# Patient Record
Sex: Female | Born: 1945 | Race: White | Hispanic: No | Smoking: Never smoker
Health system: Southern US, Community
[De-identification: ages and names within clinical notes are randomized; demographics above are authoritative.]

## PROBLEM LIST (undated history)

## (undated) ENCOUNTER — Ambulatory Visit: Admission: EM | Source: Home / Self Care

## (undated) DIAGNOSIS — I10 Essential (primary) hypertension: Secondary | ICD-10-CM

## (undated) HISTORY — PX: ROTATOR CUFF REPAIR: SHX139

## (undated) HISTORY — PX: ANKLE SURGERY: SHX546

---

## 1997-12-03 ENCOUNTER — Other Ambulatory Visit: Admission: RE | Admit: 1997-12-03 | Discharge: 1997-12-03 | Payer: Self-pay | Admitting: Obstetrics and Gynecology

## 1997-12-03 ENCOUNTER — Other Ambulatory Visit: Admission: RE | Admit: 1997-12-03 | Discharge: 1997-12-03 | Payer: Self-pay | Admitting: *Deleted

## 1998-12-06 ENCOUNTER — Other Ambulatory Visit: Admission: RE | Admit: 1998-12-06 | Discharge: 1998-12-06 | Payer: Self-pay | Admitting: *Deleted

## 1998-12-07 ENCOUNTER — Other Ambulatory Visit: Admission: RE | Admit: 1998-12-07 | Discharge: 1998-12-07 | Payer: Self-pay | Admitting: Obstetrics and Gynecology

## 2006-06-14 ENCOUNTER — Ambulatory Visit: Payer: Self-pay | Admitting: Internal Medicine

## 2006-06-26 ENCOUNTER — Ambulatory Visit: Payer: Self-pay | Admitting: Internal Medicine

## 2006-07-05 ENCOUNTER — Ambulatory Visit: Payer: Self-pay | Admitting: Surgery

## 2007-01-18 ENCOUNTER — Ambulatory Visit: Payer: Self-pay | Admitting: Family Medicine

## 2007-01-21 ENCOUNTER — Ambulatory Visit: Payer: Self-pay | Admitting: Internal Medicine

## 2007-07-16 ENCOUNTER — Ambulatory Visit: Payer: Self-pay | Admitting: Family Medicine

## 2007-09-26 ENCOUNTER — Ambulatory Visit: Payer: Self-pay | Admitting: Surgery

## 2007-11-29 IMAGING — US ULTRASOUND RIGHT BREAST
1 series · 16 of 16 positions shown · non-contrast
Comparison: none

REASON FOR EXAM: right breast nodular density   US if needed
COMMENTS:

PROCEDURE:     US  - US BREAST RIGHT  - June 26, 2006  [DATE]
RESULT:        There is a 7 mm complex cystic lesion in the inferior medial
portion of the RIGHT breast.  This could represent a small malignancy and
surgical consultation is suggested.

[Series 1: ultrasound right breast · 16 of 16 slices shown]
[im 1/16]
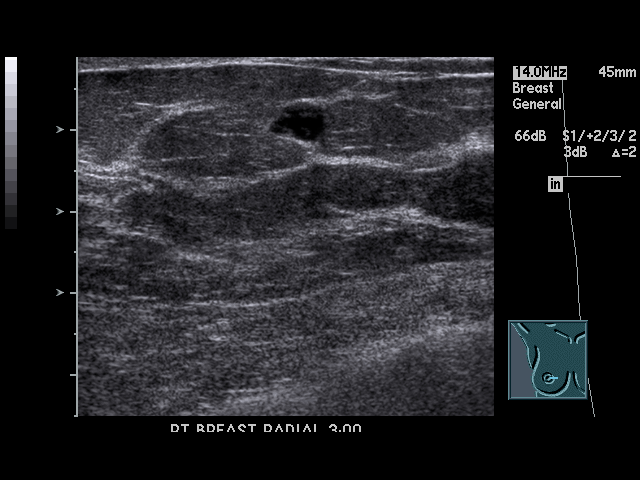
[im 2/16]
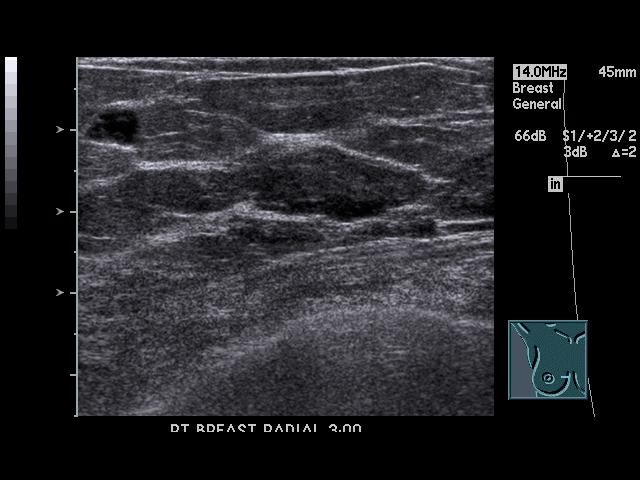
[im 3/16]
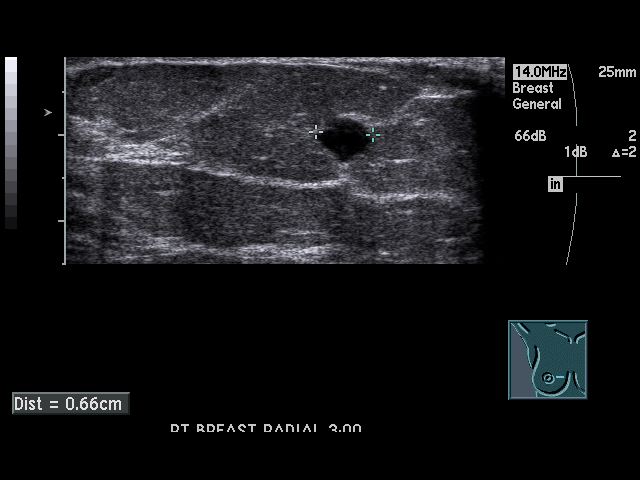
[im 4/16]
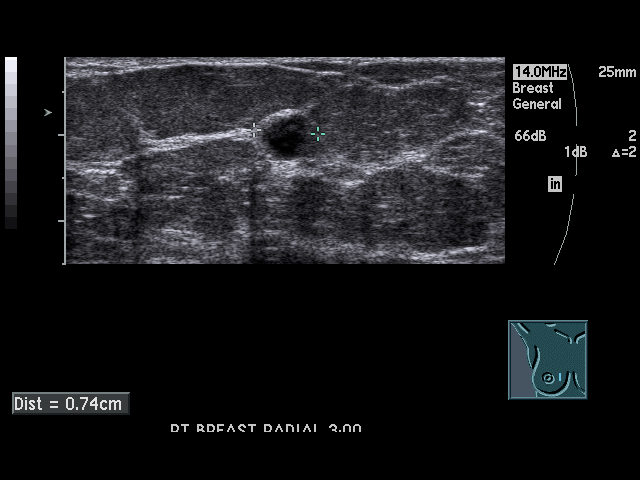
[im 5/16]
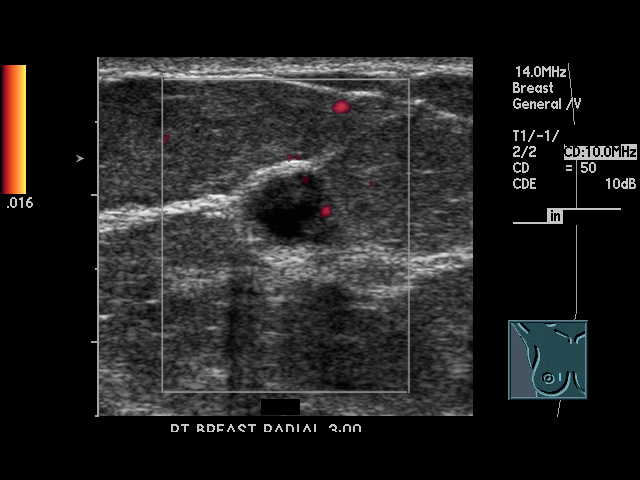
[im 6/16]
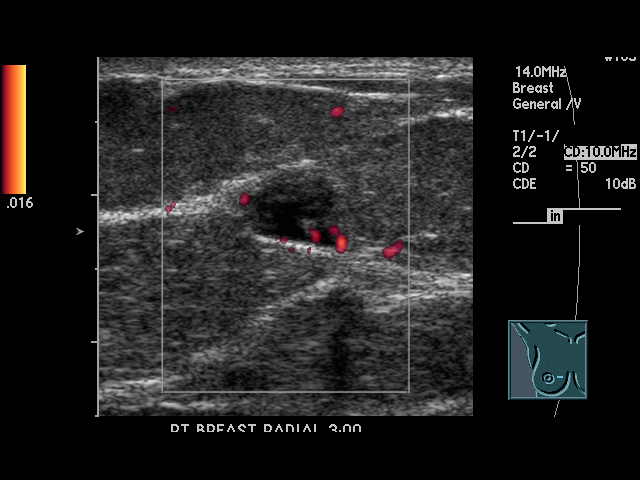
[im 7/16]
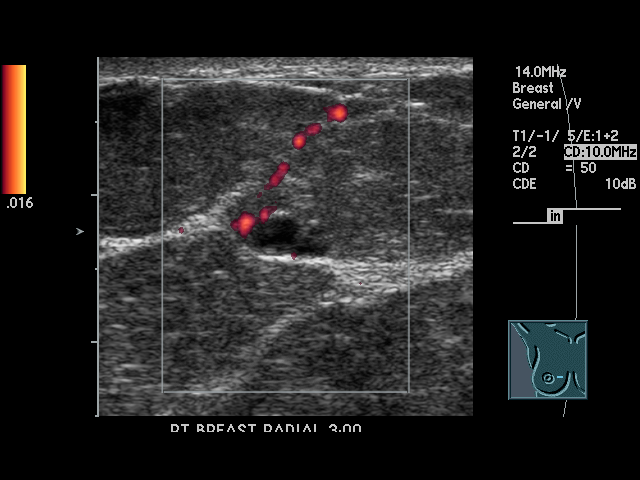
[im 8/16]
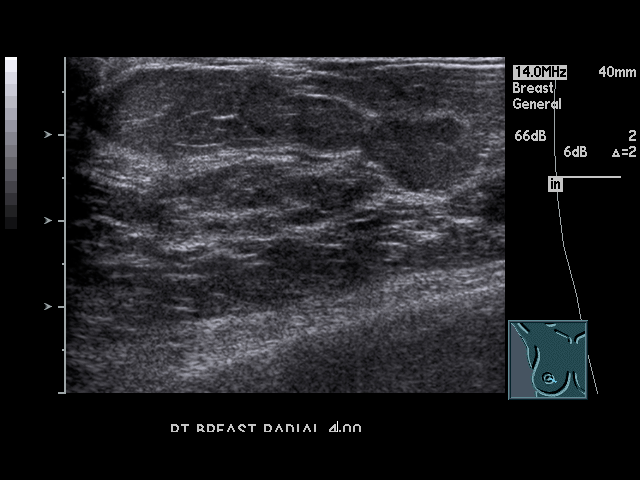
[im 9/16]
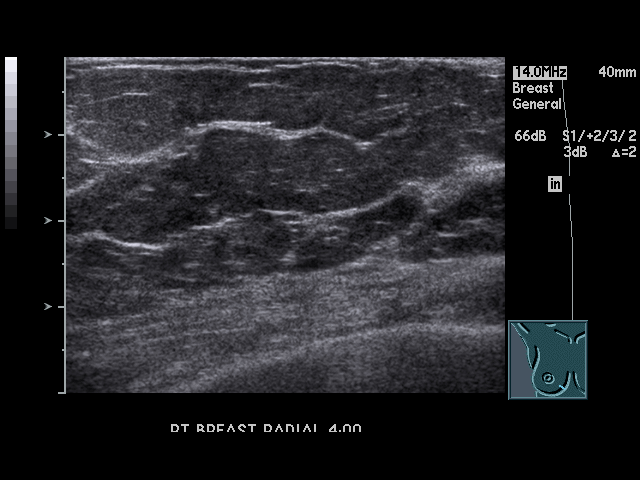
[im 10/16]
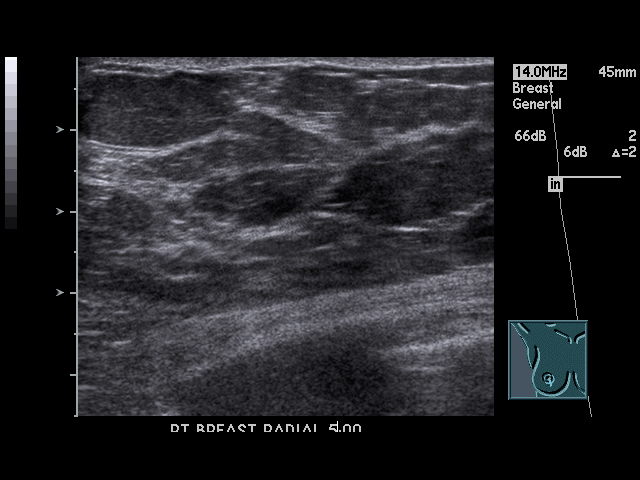
[im 11/16]
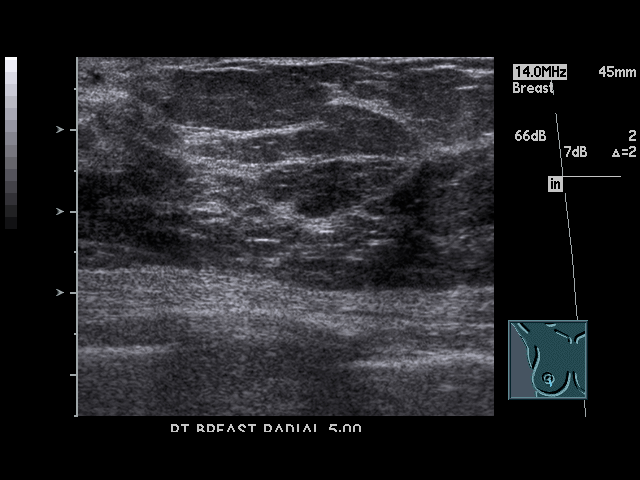
[im 12/16]
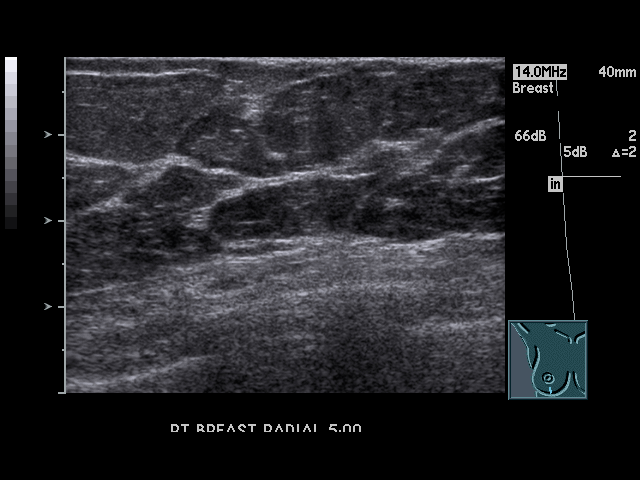
[im 13/16]
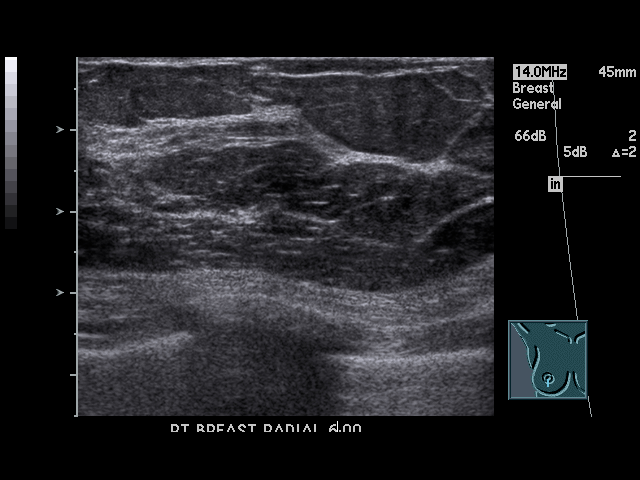
[im 14/16]
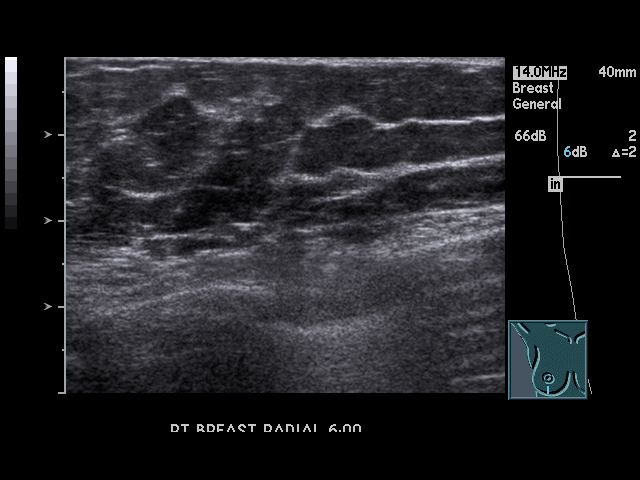
[im 15/16]
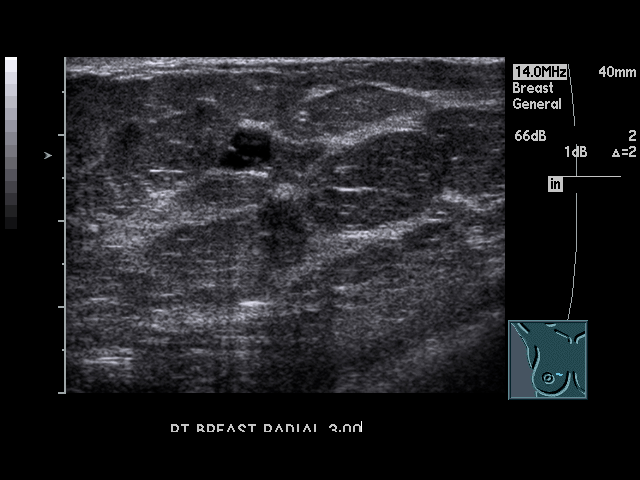
[im 16/16]
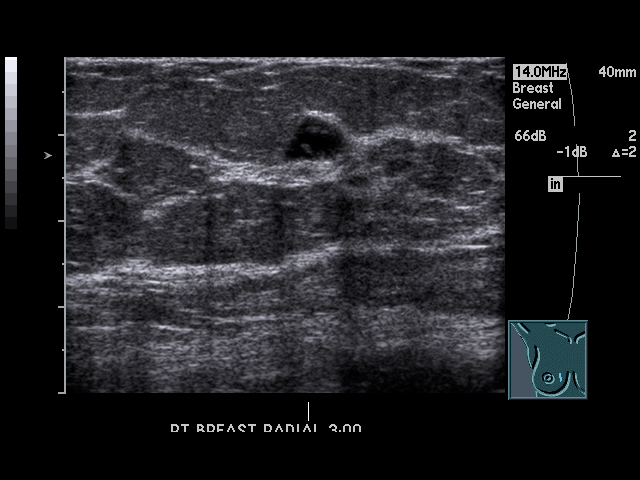

[16 of 16 positions shown; findings below may reference images not displayed]

IMPRESSION: 7 mm complex cystic lesion in the inferior medial portion of the RIGHT
breast.  Although this is most likely a benign cyst, a cystic malignancy
could present in this fashion and surgical consultation is suggested. Needle
localization and biopsy is recommended.

## 2008-10-07 ENCOUNTER — Ambulatory Visit: Payer: Self-pay | Admitting: Surgery

## 2009-09-05 ENCOUNTER — Ambulatory Visit: Payer: Self-pay | Admitting: Internal Medicine

## 2009-09-07 ENCOUNTER — Ambulatory Visit: Payer: Self-pay | Admitting: Internal Medicine

## 2009-09-15 ENCOUNTER — Ambulatory Visit: Payer: Self-pay | Admitting: Internal Medicine

## 2014-11-21 ENCOUNTER — Ambulatory Visit
Admission: EM | Admit: 2014-11-21 | Discharge: 2014-11-21 | Disposition: A | Discharge status: Home / Self Care | Payer: Medicare Other | Attending: Emergency Medicine | Admitting: Emergency Medicine

## 2014-11-21 ENCOUNTER — Encounter: Payer: Self-pay | Admitting: Emergency Medicine

## 2014-11-21 ENCOUNTER — Ambulatory Visit: Payer: Medicare Other

## 2014-11-21 DIAGNOSIS — S8262XA Displaced fracture of lateral malleolus of left fibula, initial encounter for closed fracture: Secondary | ICD-10-CM | POA: Diagnosis not present

## 2014-11-21 DIAGNOSIS — W010XXA Fall on same level from slipping, tripping and stumbling without subsequent striking against object, initial encounter: Secondary | ICD-10-CM | POA: Diagnosis not present

## 2014-11-21 DIAGNOSIS — M79672 Pain in left foot: Secondary | ICD-10-CM | POA: Diagnosis present

## 2014-11-21 HISTORY — DX: Essential (primary) hypertension: I10

## 2014-11-21 MED ORDER — ACETAMINOPHEN 500 MG PO TABS: 1000.0000 mg | ORAL_TABLET | Freq: Four times a day (QID) | ORAL | Status: DC | PRN

## 2014-11-21 MED ORDER — ACETAMINOPHEN-CODEINE #3 300-30 MG PO TABS: 1.0000 | ORAL_TABLET | Freq: Every evening | ORAL | Status: DC | PRN

## 2014-11-21 MED ORDER — IBUPROFEN 800 MG PO TABS: 800.0000 mg | ORAL_TABLET | Freq: Three times a day (TID) | ORAL | Status: AC

## 2014-11-21 NOTE — Discharge Instructions (Signed)
Ankle Fracture °A fracture is a break in a bone. The ankle joint is made up of three bones. These include the lower (distal) sections of your lower leg bones, called the tibia and fibula, along with a bone in your foot, called the talus. Depending on how bad the break is and if more than one ankle joint bone is broken, a cast or splint is used to protect and keep your injured bone from moving while it heals. Sometimes, surgery is required to help the fracture heal properly.  °There are two general types of fractures: °· Stable fracture. This includes a single fracture line through one bone, with no injury to ankle ligaments. A fracture of the talus that does not have any displacement (movement of the bone on either side of the fracture line) is also stable. °· Unstable fracture. This includes more than one fracture line through one or more bones in the ankle joint. It also includes fractures that have displacement of the bone on either side of the fracture line. °CAUSES °1. A direct blow to the ankle.   °2. Quickly and severely twisting your ankle. °3. Trauma, such as a car accident or falling from a significant height. °RISK FACTORS °You may be at a higher risk of ankle fracture if: °1. You have certain medical conditions. °2. You are involved in high-impact sports. °3. You are involved in a high-impact car accident. °SIGNS AND SYMPTOMS  °1. Tender and swollen ankle. °2. Bruising around the injured ankle. °3. Pain on movement of the ankle. °4. Difficulty walking or putting weight on the ankle. °5. A cold foot below the site of the ankle injury. This can occur if the blood vessels passing through your injured ankle were also damaged. °6. Numbness in the foot below the site of the ankle injury. °DIAGNOSIS  °An ankle fracture is usually diagnosed with a physical exam and X-rays. A CT scan may also be required for complex fractures. °TREATMENT  °Stable fractures are treated with a cast or splint and using crutches to  avoid putting weight on your injured ankle. This is followed by an ankle strengthening program. Some patients require a special type of cast, depending on other medical problems they may have. Unstable fractures require surgery to ensure the bones heal properly. Your health care provider will tell you what type of fracture you have and the best treatment for your condition. °HOME CARE INSTRUCTIONS  °1. Review correct crutch use with your health care provider and use your crutches as directed. Safe use of crutches is extremely important. Misuse of crutches can cause you to fall or cause injury to nerves in your hands or armpits. °2. Do not put weight or pressure on the injured ankle until directed by your health care provider. °3. To lessen the swelling, keep the injured leg elevated while sitting or lying down. °4. Apply ice to the injured area: °1. Put ice in a plastic bag. °2. Place a towel between your cast and the bag. °3. Leave the ice on for 20 minutes, 2-3 times a day. °5. If you have a plaster or fiberglass cast: °1. Do not try to scratch the skin under the cast with any objects. This can increase your risk of skin infection. °2. Check the skin around the cast every day. You may put lotion on any red or sore areas. °3. Keep your cast dry and clean. °6. If you have a plaster splint: °1. Wear the splint as directed. °2. You may loosen the elastic   around the splint if your toes become numb, tingle, or turn cold or blue. °7. Do not put pressure on any part of your cast or splint; it may break. Rest your cast only on a pillow the first 24 hours until it is fully hardened. °8. Your cast or splint can be protected during bathing with a plastic bag sealed to your skin with medical tape. Do not lower the cast or splint into water. °9. Take medicines as directed by your health care provider. Only take over-the-counter or prescription medicines for pain, discomfort, or fever as directed by your health care  provider. °10. Do not drive a vehicle until your health care provider specifically tells you it is safe to do so. °11. If your health care provider has given you a follow-up appointment, it is very important to keep that appointment. Not keeping the appointment could result in a chronic or permanent injury, pain, and disability. If you have any problem keeping the appointment, call the facility for assistance. °SEEK MEDICAL CARE IF: °You develop increased swelling or discomfort. °SEEK IMMEDIATE MEDICAL CARE IF:  °1. Your cast gets damaged or breaks. °2. You have continued severe pain. °3. You develop new pain or swelling after the cast was put on. °4. Your skin or toenails below the injury turn blue or gray. °5. Your skin or toenails below the injury feel cold, numb, or have loss of sensitivity to touch. °6. There is a bad smell or pus draining from under the cast. °MAKE SURE YOU:  °1. Understand these instructions. °2. Will watch your condition. °3. Will get help right away if you are not doing well or get worse. °Document Released: 03/24/2000 Document Revised: 04/01/2013 Document Reviewed: 10/24/2012 °Exit[employer]® Patient Information ©2015 [employer]. This information is not intended to replace advice given to you by your health care provider. Make sure you discuss any questions you have with your health care provider. °Cast or Splint Care °Casts and splints support injured limbs and keep bones from moving while they heal. It is important to care for your cast or splint at home.   °HOME CARE INSTRUCTIONS °· Keep the cast or splint uncovered during the drying period. It can take 24 to 48 hours to dry if it is made of plaster. A fiberglass cast will dry in less than 1 hour. °· Do not rest the cast on anything harder than a pillow for the first 24 hours. °· Do not put weight on your injured limb or apply pressure to the cast until your health care provider gives you permission. °· Keep the cast or splint dry. Wet  casts or splints can lose their shape and may not support the limb as well. A wet cast that has lost its shape can also create harmful pressure on your skin when it dries. Also, wet skin can become infected. °· Cover the cast or splint with a plastic bag when bathing or when out in the rain or snow. If the cast is on the trunk of the body, take sponge baths until the cast is removed. °· If your cast does become wet, dry it with a towel or a blow dryer on the cool setting only. °· Keep your cast or splint clean. Soiled casts may be wiped with a moistened cloth. °· Do not place any hard or soft foreign objects under your cast or splint, such as cotton, toilet paper, lotion, or powder. °· Do not try to scratch the skin under the cast with any   object. The object could get stuck inside the cast. Also, scratching could lead to an infection. If itching is a problem, use a blow dryer on a cool setting to relieve discomfort. °· Do not trim or cut your cast or remove padding from inside of it. °· Exercise all joints next to the injury that are not immobilized by the cast or splint. For example, if you have a long leg cast, exercise the hip joint and toes. If you have an arm cast or splint, exercise the shoulder, elbow, thumb, and fingers. °· Elevate your injured arm or leg on 1 or 2 pillows for the first 1 to 3 days to decrease swelling and pain. It is best if you can comfortably elevate your cast so it is higher than your heart. °SEEK MEDICAL CARE IF:  °4. Your cast or splint cracks. °5. Your cast or splint is too tight or too loose. °6. You have unbearable itching inside the cast. °7. Your cast becomes wet or develops a soft spot or area. °8. You have a bad smell coming from inside your cast. °9. You get an object stuck under your cast. °10. Your skin around the cast becomes red or raw. °11. You have new pain or worsening pain after the cast has been applied. °SEEK IMMEDIATE MEDICAL CARE IF:  °4. You have fluid leaking  through the cast. °5. You are unable to move your fingers or toes. °6. You have discolored (blue or white), cool, painful, or very swollen fingers or toes beyond the cast. °7. You have tingling or numbness around the injured area. °8. You have severe pain or pressure under the cast. °9. You have any difficulty with your breathing or have shortness of breath. °10. You have chest pain. °Document Released: 03/24/2000 Document Revised: 01/15/2013 Document Reviewed: 10/03/2012 °ExitCare® Patient Information ©2015 [employer]. This information is not intended to replace advice given to you by your health care provider. Make sure you discuss any questions you have with your health care provider. °Crutch Use °Crutches are used to take weight off one of your legs or feet when you stand or walk. It is important to use crutches that fit properly. When fitted properly: °· Each crutch should be 2-3 finger widths below the armpit. °· Your weight should be supported by your hand, and not by resting the armpit on the crutch. ° °RISKS AND COMPLICATIONS °Damage to the nerves that extend from your armpit to your hand and arm. To prevent this from happening, make sure your crutches fit properly and do not put pressure on your armpit when using them. °HOW TO USE YOUR CRUTCHES °If you have been instructed to use partial weight bearing, apply (bear) the amount of weight as your health care provider suggests. Do not bear weight in an amount that causes pain to the area of injury. °Walking °12. Step with the crutches. °13. Swing the healthy leg slightly ahead of the crutches. °Going Up Steps °If there is no handrail: °11. Step up with the healthy leg. °12. Step up with the crutches and injured leg. °13. Continue in this way. °If there is a handrail: °7. Hold both crutches in one hand. °8. Place your free hand on the handrail. °9. While putting your weight on your arms, lift your healthy leg to the step. °10. Bring the crutches and the  injured leg up to that step. °11. Continue in this way. °Going Down Steps °Be very careful, as going down stairs with crutches is very   challenging. If there is no handrail: °12. Step down with the injured leg and crutches. °13. Step down with the healthy leg. °If there is a handrail: °7. Place your hand on the handrail. °8. Hold both crutches with your free hand. °9. Lower your injured leg and crutch to the step below you. Make sure to keep the crutch tips in the center of the step, never on the edge. °10. Lower your healthy leg to that step. °11. Continue in this way. °Standing Up °4. Hold the injured leg forward. °5. Grab the armrest with one hand and the top of the crutches with the other hand. °6. Using these supports, pull yourself up to a standing position. °Sitting Down °1. Hold the injured leg forward. °2. Grab the armrest with one hand and the top of the crutches with the other hand. °3. Lower yourself to a sitting position. °SEEK MEDICAL CARE IF: °· You still feel unsteady on your feet. °· You develop new pain, for example in your armpits, back, shoulder, wrist, or hip. °· You develop any numbness or tingling. °SEEK IMMEDIATE MEDICAL CARE IF: °You fall. °Document Released: 03/24/2000 Document Revised: 04/01/2013 Document Reviewed: 12/02/2012 °ExitCare® Patient Information ©2015 [employer]. This information is not intended to replace advice given to you by your health care provider. Make sure you discuss any questions you have with your health care provider. ° °

## 2014-11-21 NOTE — ED Notes (Signed)
Injured left foot last night, Fell over dog

## 2014-11-21 NOTE — ED Provider Notes (Signed)
CSN: [id number]     Arrival date & time 11/21/14  1433 History   First MD Initiated Contact with Patient 11/21/14 1601     Chief Complaint  Patient presents with  . Foot Injury   (Consider location/radiation/quality/duration/timing/severity/associated sxs/prior Treatment) HPI Comments: Married caucasian female here for evaluation of left foot and ankle pain.  Did not notice her dog (boxer) sleeping next to living room chair stood up and tripped on dog onto hardwood floor twisted left ankle immediately swelled and did not want to weight bear on it.  Tried elevating and icing last night.  Today ok if resting but still painful, slightly swollen and bruised near all her toes and heel of foot.  Pain with bending toes left foot worst big toe.  Pain with moving ankle side to side sharp pain.  Patient is a 69 y.o. female presenting with foot injury. The history is provided by the patient.  Foot Injury Location:  Ankle, foot and toe Time since incident:  1 day Injury: yes   Mechanism of injury: fall   Fall:    Fall occurred:  Tripped   Height of fall:  Standing   Impact surface:  Hard floor   Point of impact:  Feet   Entrapped after fall: no   Ankle location:  L ankle Foot location:  L foot Toe location:  L big toe, L second toe, L third toe, L fourth toe and L little toe Pain details:    Quality:  Aching and throbbing   Radiates to:  Does not radiate   Severity:  Moderate   Onset quality:  Sudden   Duration:  1 day   Timing:  Intermittent   Progression:  Unchanged Chronicity:  New Foreign body present:  No foreign bodies Prior injury to area:  No Relieved by:  Nothing Worsened by:  Activity, bearing weight, abduction, adduction, flexion, rotation and extension Ineffective treatments:  Ice, rest, NSAIDs, movement and immobilization Associated symptoms: decreased ROM, stiffness and swelling   Associated symptoms: no back pain, no fatigue, no fever, no itching, no muscle weakness, no  neck pain, no numbness and no tingling   Risk factors: no concern for non-accidental trauma, no frequent fractures, no known bone disorder, no obesity and no recent illness     Past Medical History  Diagnosis Date  . Hypertension    Past Surgical History  Procedure Laterality Date  . Rotator cuff repair    . Cesarean section    . Ankle surgery Right    No family history on file. Social History  Substance Use Topics  . Smoking status: Never Smoker   . Smokeless tobacco: Never Used  . Alcohol Use: 0.6 oz/week    1 Glasses of wine per week   OB History    No data available     Review of Systems  Constitutional: Negative for fever, chills, diaphoresis, activity change, appetite change and fatigue.  HENT: Negative for congestion, dental problem, drooling, ear discharge, ear pain, facial swelling, hearing loss and mouth sores.   Eyes: Negative for photophobia, pain, discharge, redness, itching and visual disturbance.  Respiratory: Negative for cough, shortness of breath, wheezing and stridor.   Cardiovascular: Negative for chest pain and leg swelling.  Gastrointestinal: Negative for nausea, vomiting, diarrhea, constipation and blood in stool.  Endocrine: Negative for cold intolerance and heat intolerance.  Genitourinary: Negative for dysuria and hematuria.  Musculoskeletal: Positive for myalgias, joint swelling, arthralgias, gait problem and stiffness. Negative for back pain,  neck pain and neck stiffness.  Skin: Positive for color change. Negative for itching, pallor, rash and wound.  Allergic/Immunologic: Negative for environmental allergies and food allergies.  Neurological: Negative for dizziness, tremors, seizures, syncope, facial asymmetry, speech difficulty, weakness, light-headedness, numbness and headaches.  Hematological: Negative for adenopathy. Does not bruise/bleed easily.  Psychiatric/Behavioral: Negative for behavioral problems, confusion, sleep disturbance and  agitation.    Allergies  Review of patient's allergies indicates no known allergies.  Home Medications   Prior to Admission medications   Medication Sig Start Date End Date Taking? Authorizing Provider  benazepril (LOTENSIN) 10 MG tablet Take 10 mg by mouth daily.   Yes Historical Provider, MD  acetaminophen (TYLENOL) 500 MG tablet Take 2 tablets (1,000 mg total) by mouth every 6 (six) hours as needed for mild pain or moderate pain. 11/21/14   Barbaraann Barthel, NP  acetaminophen-codeine (TYLENOL #3) 300-30 MG per tablet Take 1 tablet by mouth at bedtime as needed for moderate pain or severe pain. 11/21/14   Barbaraann Barthel, NP  ibuprofen (ADVIL,MOTRIN) 800 MG tablet Take 1 tablet (800 mg total) by mouth 3 (three) times daily. 11/21/14   Jarold Song Betancourt, NP   BP 160/79 mmHg  Pulse 86  Temp(Src) 98.3 F (36.8 C) (Tympanic)  Resp 16  Ht 5\' 7"  (1.702 m)  Wt 170 lb (77.111 kg)  BMI 26.62 kg/m2  SpO2 98% Physical Exam  Constitutional: She is oriented to person, place, and time. Vital signs are normal. She appears well-developed and well-nourished. No distress.  HENT:  Head: Normocephalic and atraumatic.  Right Ear: External ear normal.  Left Ear: External ear normal.  Nose: Nose normal.  Mouth/Throat: Oropharynx is clear and moist. No oropharyngeal exudate.  Eyes: Conjunctivae, EOM and lids are normal. Pupils are equal, round, and reactive to light. Right eye exhibits no discharge. Left eye exhibits no discharge. No scleral icterus.  Neck: Trachea normal and normal range of motion. Neck supple. No tracheal deviation present.  Cardiovascular: Normal rate, regular rhythm and intact distal pulses.   Pulmonary/Chest: Effort normal and breath sounds normal. No stridor. No respiratory distress. She has no wheezes. She has no rales.  Abdominal: Soft. She exhibits no distension.  Musculoskeletal: She exhibits edema and tenderness.       Right ankle: Normal.       Left ankle: She exhibits  decreased range of motion, swelling and ecchymosis. She exhibits no deformity, no laceration and normal pulse. Tenderness. Lateral malleolus and head of 5th metatarsal tenderness found. No medial malleolus, no AITFL, no CF ligament, no posterior TFL and no proximal fibula tenderness found. Achilles tendon normal. Achilles tendon exhibits no pain and no defect.       Feet:  Pain with flexion, extension, eversion, inversion; distal tib/fib squeeze negative; pain with toe all flexion and extension worst with flexion distal foot/MTP joints  Neurological: She is alert and oriented to person, place, and time. She has normal reflexes. She displays no atrophy, no tremor and normal reflexes. No cranial nerve deficit or sensory deficit. She exhibits normal muscle tone. She displays no seizure activity. Coordination and gait normal.  Reflex Scores:      Achilles reflexes are 2+ on the right side and 2+ on the left side. Skin: Skin is warm, dry and intact. Bruising and ecchymosis noted. No abrasion, no burn, no laceration, no lesion, no petechiae, no purpura and no rash noted. Rash is not macular, not papular, not maculopapular, not nodular, not pustular, not  vesicular and not urticarial. She is not diaphoretic. No cyanosis or erythema. No pallor. Nails show no clubbing.     Psychiatric: She has a normal mood and affect. Her speech is normal and behavior is normal. Judgment and thought content normal. Cognition and memory are normal.  Nursing note and vitals reviewed.   ED Course  Procedures (including critical care time) Labs Review Labs Reviewed - No data to display  Imaging Review Dg Ankle Complete Left  11/21/2014   CLINICAL DATA:  69 year old female who tripped over dog yesterday with pain and swelling. Initial encounter.  EXAM: LEFT ANKLE COMPLETE - 3+ VIEW  COMPARISON:  None.  FINDINGS: Small comminuted minimally displaced fracture fragments at the tip of the left lateral malleolus. Overlying soft  tissue swelling. Mortise joint alignment is preserved. The talar dome is intact. Calcaneus intact. Small chronic appearing ossific fragment at the medial malleolus, no acute distal tibia fracture identified. No definite joint effusion.  IMPRESSION: Comminuted but minimally displaced fracture at the tip of the left lateral malleolus.   Electronically Signed   By: Odessa Fleming M.D.   On: 11/21/2014 16:35   Dg Foot Complete Left  11/21/2014   CLINICAL DATA:  69 year old female with history of trauma from a fall injuring the left foot and ankle yesterday complaining of pain and swelling overlying the lateral and anterior aspect of the left foot and ankle.  EXAM: LEFT FOOT - COMPLETE 3+ VIEW  COMPARISON:  No priors.  FINDINGS: Multiple views of the left foot demonstrate no acute displaced fracture, subluxation or dislocation. Soft tissue swelling dorsal to the midfoot incidentally noted.  IMPRESSION: No acute radiographic abnormality of the bones of the left foot.   Electronically Signed   By: Trudie Reed M.D.   On: 11/21/2014 16:35  1600 offered pain medication and patient refused at this time.  Elevated, iced awaiting radiology  Discussed xray results with patient at 1700.  Given copy of radiology reports and disk from radiology tech with images given to patient for use at her follow up appt with [facility] per her preference.  Patient to schedule her appt Monday 15 Aug by contacting their appt clerk as office closed over the weekend.  Follow up if toes blue/foot cold, worsening pain over weekend despite ice, rest, non weight bearing left foot/crutches use, elevation and pain medication for re-evaluation.  Patient verbalized understanding of information/instructions, agreed with plan of care and had no further questions at this time.  MDM   1. Closed low lateral malleolus fracture, left, initial encounter    Patient was instructed to rest, ice and elevate the ankle as much as possible this upcoming  week.  Avoid prolonged standing/dependent position left foot.   Wear camboot for support use crutches no weight bearing until cleared by orthopedics.  Patient preferred [facility] given number to call Monday as closed Sat/Sun and discussed hours [phone] M-F.  Tylenol 1000mg  po QID preferred for pain as does not raise blood pressure/counteract her lotensin.  May use motrin 800mg  po TID for breakthrough pain or tylenol #3 (previously taken without side effects) at bedtime for sleep #5 RF0.  Reminded patient if taking Tylenol #3 at bedtime needs to reduce tylenol dose to 500mg  immediately prior to bedtime so not to accidentally take more than 4000mg  tylenol per 24 hours or 1000mg  per dose. Discussed no driving for 8 hours after taking Tylenol #3 and avoid alcohol consumption while taking tylenol #3 pain medications.  Discussed at risk  to reinjure ankle over the next year and to wear supportive footwear/ankle sleeve/ace bandage once released back to normal street shoes.  [employer] handout on ankle fracture given to patient.  Patient retired work note note required at this time.  Patient verbalized agreement and understanding of treatment plan.   P2:  Injury Prevention and Fitness.    Barbaraann Barthel, NP 11/21/14 [phone]

## 2017-10-31 DIAGNOSIS — H40013 Open angle with borderline findings, low risk, bilateral: Secondary | ICD-10-CM | POA: Insufficient documentation

## 2017-10-31 DIAGNOSIS — H2513 Age-related nuclear cataract, bilateral: Secondary | ICD-10-CM | POA: Insufficient documentation

## 2018-05-09 ENCOUNTER — Encounter: Payer: Self-pay | Admitting: Emergency Medicine

## 2018-05-09 ENCOUNTER — Ambulatory Visit
Admission: EM | Admit: 2018-05-09 | Discharge: 2018-05-09 | Disposition: A | Discharge status: Home / Self Care | Payer: Medicare Other | Attending: Emergency Medicine | Admitting: Emergency Medicine

## 2018-05-09 ENCOUNTER — Other Ambulatory Visit: Payer: Self-pay

## 2018-05-09 ENCOUNTER — Ambulatory Visit (INDEPENDENT_AMBULATORY_CARE_PROVIDER_SITE_OTHER): Payer: Medicare Other

## 2018-05-09 DIAGNOSIS — S62616A Displaced fracture of proximal phalanx of right little finger, initial encounter for closed fracture: Secondary | ICD-10-CM | POA: Diagnosis not present

## 2018-05-09 DIAGNOSIS — S63286A Dislocation of proximal interphalangeal joint of right little finger, initial encounter: Secondary | ICD-10-CM | POA: Diagnosis not present

## 2018-05-09 DIAGNOSIS — W2209XA Striking against other stationary object, initial encounter: Secondary | ICD-10-CM | POA: Diagnosis not present

## 2018-05-09 DIAGNOSIS — S60946A Unspecified superficial injury of right little finger, initial encounter: Secondary | ICD-10-CM | POA: Diagnosis not present

## 2018-05-09 DIAGNOSIS — W182XXA Fall in (into) shower or empty bathtub, initial encounter: Secondary | ICD-10-CM

## 2018-05-09 DIAGNOSIS — S63256A Unspecified dislocation of right little finger, initial encounter: Secondary | ICD-10-CM

## 2018-05-09 NOTE — ED Triage Notes (Signed)
Patient c/o right pinky finger pain that started this morning. She stated she slipped in the shower this morning and caught herself with her hand bending her pinky finger back.

## 2018-05-09 NOTE — ED Provider Notes (Signed)
[facility] ____________________________________________  Time seen: Approximately 10:10 AM  I have reviewed the triage vital signs and the nursing notes.   HISTORY  Chief Complaint Hand Pain   HPI Lauren Roberson is a 73 y.o. female presenting for evaluation of right pinky finger pain after injury that occurred this morning.  States that she slipped while in the shower and hit her finger against the shower door causing it to bend somewhat backwards into the side.  Denies any other pain or injuries.  No fall otherwise.  Reports otherwise doing well.  States injury happened only because she slipped.  States pain is mild, but reports unable to bend the finger.  Right-hand-dominant.  Did try to bend the finger back without much luck.  Denies other alleviating measures attempted.  Denies other aggravating factors reports otherwise doing well.   Past Medical History:  Diagnosis Date  . Hypertension     There are no active problems to display for this patient.   Past Surgical History:  Procedure Laterality Date  . ANKLE SURGERY Right   . CESAREAN SECTION    . ROTATOR CUFF REPAIR       No current facility-administered medications for this encounter.   Current Outpatient Medications:  .  benazepril (LOTENSIN) 10 MG tablet, Take 10 mg by mouth daily., Disp: , Rfl:  .  acetaminophen (TYLENOL) 500 MG tablet, Take 2 tablets (1,000 mg total) by mouth every 6 (six) hours as needed for mild pain or moderate pain., Disp: 30 tablet, Rfl: 0 .  acetaminophen-codeine (TYLENOL #3) 300-30 MG per tablet, Take 1 tablet by mouth at bedtime as needed for moderate pain or severe pain., Disp: 5 tablet, Rfl: 0 .  ibuprofen (ADVIL,MOTRIN) 800 MG tablet, Take 1 tablet (800 mg total) by mouth 3 (three) times daily., Disp: 30 tablet, Rfl: 0  Allergies Patient has no known allergies.  History reviewed. No pertinent family history.  Social History Social History   Tobacco Use  . Smoking  status: Never Smoker  . Smokeless tobacco: Never Used  Substance Use Topics  . Alcohol use: Yes    Alcohol/week: 1.0 standard drinks    Types: 1 Glasses of wine per week  . Drug use: No    Review of Systems Constitutional: No fever Cardiovascular: Denies chest pain. Respiratory: Denies shortness of breath. Gastrointestinal: No abdominal pain.  Musculoskeletal: Negative for back pain.  As above. Skin: Negative for rash.   ____________________________________________   PHYSICAL EXAM:  VITAL SIGNS: ED Triage Vitals  Enc Vitals Group     BP 05/09/18 0841 134/82     Pulse Rate 05/09/18 0841 73     Resp 05/09/18 0841 18     Temp 05/09/18 0841 98.3 F (36.8 C)     Temp Source 05/09/18 0841 Oral     SpO2 05/09/18 0841 97 %     Weight 05/09/18 0840 160 lb (72.6 kg)     Height 05/09/18 0840 5\' 7"  (1.702 m)     Head Circumference --      Peak Flow --      Pain Score 05/09/18 0839 8     Pain Loc --      Pain Edu? --      Excl. in GC? --     Constitutional: Alert and oriented. Well appearing and in no acute distress. ENT      Head: Normocephalic and atraumatic. Cardiovascular: Normal rate, regular rhythm. Grossly normal heart sounds.  Good peripheral circulation. Respiratory:  Normal respiratory effort without tachypnea nor retractions. Breath sounds are clear and equal bilaterally. No wheezes, rales, rhonchi. Musculoskeletal: Steady gait.  Bilateral distal radial pulses equal and easily palpated. Except: Right hand fifth digit deformity noted at middle phalanx with mild localized edema, unable to flex at PIP joint, normal distal sensation and capillary refill, good distal resisted flexion and extension, post reduction reevaluated with full ability to flex at PIP joint and continues with good resisted flexion and extension. Neurologic:  Normal speech and language. No gross focal neurologic deficits are appreciated. Speech is normal. No gait instability.  Skin:  Skin is warm, dry  and intact. No rash noted. Psychiatric: Mood and affect are normal. Speech and behavior are normal. Patient exhibits appropriate insight and judgment   ___________________________________________   LABS (all labs ordered are listed, but only abnormal results are displayed)  Labs Reviewed - No data to display ____________________________________________  RADIOLOGY  Dg Finger Little Right  Result Date: 05/09/2018 CLINICAL DATA:  Status post finger dislocation reduction EXAM: RIGHT LITTLE FINGER 2+V COMPARISON:  Same day FINDINGS: Interval reduction of right fifth PIP dislocation with anatomic alignment. Sliver of bone along the dorsal ulnar aspect of the head of fifth proximal phalanx consistent with a nondisplaced fracture. No other fracture or dislocation. 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 IMPRESSION: 1. Interval reduction of right fifth PIP dislocation with anatomic alignment. Sliver of bone along the dorsal ulnar aspect of the head of fifth proximal phalanx consistent with a nondisplaced fracture. Electronically Signed   By: Elige Ko   On: 05/09/2018 10:14   Dg Finger Little Right  Result Date: 05/09/2018 CLINICAL DATA:  Fall with finger injury. EXAM: RIGHT LITTLE FINGER 2+V COMPARISON:  None. FINDINGS: Dorsal and ulnar severe subluxation of the fifth PIP joint with small avulsion fracture seen medially and dorsally. Osteopenia. IMPRESSION: Severe subluxation of the fifth PIP joint with small avulsion fracture. Electronically Signed   By: Marnee Spring M.D.   On: 05/09/2018 09:21   ____________________________________________   PROCEDURES Procedures   Finger reduction Procedure explained and verbal consent obtained. Anesthesia: None Right fifth digit held with proximal and distal traction, finger hyperextended and slid medially, with good ability to flex at PIP joint afterwards.  Patient tolerated well.   INITIAL IMPRESSION / ASSESSMENT AND PLAN / ED  COURSE  Pertinent labs & imaging results that were available during my care of the patient were reviewed by me and considered in my medical decision making (see chart for details).  Well-appearing patient.  No acute distress.  Right fifth digit pain post mechanical injury that occurred this a.m.  Dislocation noted on initial x-ray.  Finger reduced as above.  Patient tolerated well with improved range of motion.  Patient did note on initial x-ray small avulsion fracture of the proximal phalanx that still present post reduction.  Patient placed in finger splint and fourth and fifth finger buddy tape for support.  Continue finger splint for 1 week buddy tape for 3 to 4 weeks.  Ice, elevate and supportive care.  Discussed follow up with Primary care physician this week. Discussed follow up and return parameters including no resolution or any worsening concerns. Patient verbalized understanding and agreed to plan.   ____________________________________________   FINAL CLINICAL IMPRESSION(S) / ED DIAGNOSES  Final diagnoses:  Closed dislocation of right little finger  Closed displaced fracture of proximal phalanx of right little finger, initial encounter     ED Discharge Orders  None       Note: This dictation was prepared with Dragon dictation along with smaller phrase technology. Any transcriptional errors that result from this process are unintentional.         Renford DillsMiller, Trachelle Low, NP 05/09/18 1044

## 2018-05-09 NOTE — Discharge Instructions (Addendum)
Ice. Rest.  Wear finger splint and buddy tape as discussed.  Finger splint for 1 week and then buddy tape for 3 to 4 weeks.  Follow up with your primary care physician this week as needed. Return to Urgent care for new or worsening concerns.

## 2018-05-09 NOTE — ED Triage Notes (Signed)
Right pinky splint and buddy tape applied. Pt states splint is comfortable.

## 2019-05-21 DIAGNOSIS — N644 Mastodynia: Secondary | ICD-10-CM | POA: Insufficient documentation

## 2019-05-25 ENCOUNTER — Ambulatory Visit: Payer: Medicare PPO | Attending: Internal Medicine

## 2019-05-25 DIAGNOSIS — Z23 Encounter for immunization: Secondary | ICD-10-CM | POA: Insufficient documentation

## 2019-05-25 NOTE — Progress Notes (Signed)
   Covid-19 Vaccination Clinic  Name:  Lauren Roberson    MRN: [id number] DOB: 1945-07-10  05/25/2019  Ms. Lauren Roberson was observed post Covid-19 immunization for 15 minutes without incidence. She was provided with Vaccine Information Sheet and instruction to access the V-Safe system.   Ms. Lauren Roberson was instructed to call 911 with any severe reactions post vaccine: Marland Kitchen Difficulty breathing  . Swelling of your face and throat  . A fast heartbeat  . A bad rash all over your body  . Dizziness and weakness    Immunizations Administered    Name Date Dose VIS Date Route   Pfizer COVID-19 Vaccine 05/25/2019  9:43 AM 0.3 mL 03/21/2019 Intramuscular   Manufacturer: [employer], [employer]   Lot: [id number]   NDC: [address]-3

## 2019-06-18 ENCOUNTER — Ambulatory Visit: Payer: Medicare PPO | Attending: Internal Medicine

## 2019-06-18 DIAGNOSIS — Z23 Encounter for immunization: Secondary | ICD-10-CM

## 2019-06-18 NOTE — Progress Notes (Signed)
   Covid-19 Vaccination Clinic  Name:  JASMYN PICHA    MRN: [id number] DOB: 03/25/1946  06/18/2019  Ms. Carrasco was observed post Covid-19 immunization for 15 minutes without incident. She was provided with Vaccine Information Sheet and instruction to access the V-Safe system.   Ms. Starzyk was instructed to call 911 with any severe reactions post vaccine: Marland Kitchen Difficulty breathing  . Swelling of face and throat  . A fast heartbeat  . A bad rash all over body  . Dizziness and weakness   Immunizations Administered    Name Date Dose VIS Date Route   Pfizer COVID-19 Vaccine 06/18/2019  3:01 PM 0.3 mL 03/21/2019 Intramuscular   Manufacturer: [employer], [employer]   Lot: [id number]   NDC: [address]-5

## 2019-09-15 ENCOUNTER — Ambulatory Visit: Payer: Medicare Other | Admitting: Podiatry

## 2019-09-15 ENCOUNTER — Other Ambulatory Visit: Payer: Self-pay

## 2019-09-15 ENCOUNTER — Ambulatory Visit (INDEPENDENT_AMBULATORY_CARE_PROVIDER_SITE_OTHER): Payer: Medicare Other

## 2019-09-15 ENCOUNTER — Encounter: Payer: Self-pay | Admitting: Podiatry

## 2019-09-15 DIAGNOSIS — M2142 Flat foot [pes planus] (acquired), left foot: Secondary | ICD-10-CM

## 2019-09-15 DIAGNOSIS — M778 Other enthesopathies, not elsewhere classified: Secondary | ICD-10-CM

## 2019-09-15 DIAGNOSIS — M2141 Flat foot [pes planus] (acquired), right foot: Secondary | ICD-10-CM | POA: Diagnosis not present

## 2019-09-15 MED ORDER — MELOXICAM 15 MG PO TABS: 15.0000 mg | ORAL_TABLET | Freq: Every day | ORAL | 3 refills | Status: DC

## 2019-09-15 MED ORDER — METHYLPREDNISOLONE 4 MG PO TBPK: ORAL_TABLET | ORAL | 0 refills | Status: DC

## 2019-09-15 NOTE — Progress Notes (Signed)
Subjective:  Patient ID: Lauren Roberson, female    DOB: 07/09/1945,  MRN: [id number] HPI Chief Complaint  Patient presents with  . Foot Pain    Plantar foot bilateral - flat feet, burning, hot sensations, usually worse plantar forefoot, limited to certain shoes-most are uncomfortable  . New Patient (Initial Visit)    74 y.o. female presents with the above complaint.   ROS: She denies fever chills nausea vomiting muscle aches pains calf pain back pain chest pain shortness of breath.  Past Medical History:  Diagnosis Date  . Hypertension    Past Surgical History:  Procedure Laterality Date  . ANKLE SURGERY Right   . CESAREAN SECTION    . ROTATOR CUFF REPAIR      Current Outpatient Medications:  .  BIOTIN PO, Take by mouth., Disp: , Rfl:  .  Cholecalciferol (D3 PO), Take by mouth., Disp: , Rfl:  .  Pyridoxine HCl (B-6 PO), Take by mouth., Disp: , Rfl:  .  Turmeric 500 MG CAPS, Take by mouth., Disp: , Rfl:  .  benazepril (LOTENSIN) 10 MG tablet, Take 10 mg by mouth daily., Disp: , Rfl:  .  ibuprofen (ADVIL,MOTRIN) 800 MG tablet, Take 1 tablet (800 mg total) by mouth 3 (three) times daily., Disp: 30 tablet, Rfl: 0 .  meloxicam (MOBIC) 15 MG tablet, Take 1 tablet (15 mg total) by mouth daily., Disp: 30 tablet, Rfl: 3 .  methylPREDNISolone (MEDROL DOSEPAK) 4 MG TBPK tablet, 6 day dose pack - take as directed, Disp: 21 tablet, Rfl: 0 .  metoprolol succinate (TOPROL-XL) 25 MG 24 hr tablet, Take 25 mg by mouth daily., Disp: , Rfl:  .  omeprazole (PRILOSEC) 20 MG capsule, Take 20 mg by mouth daily., Disp: , Rfl:  .  simvastatin (ZOCOR) 10 MG tablet, Take 10 mg by mouth at bedtime., Disp: , Rfl:  .  venlafaxine XR (EFFEXOR-XR) 75 MG 24 hr capsule, Take 75 mg by mouth daily., Disp: , Rfl:   No Known Allergies Review of Systems Objective:  There were no vitals filed for this visit.  General: Well developed, nourished, in no acute distress, alert and oriented x3   Dermatological: Skin  is warm, dry and supple bilateral. Nails x 10 are well maintained; remaining integument appears unremarkable at this time. There are no open sores, no preulcerative lesions, no rash or signs of infection present.  Vascular: Dorsalis Pedis artery and Posterior Tibial artery pedal pulses are 2/4 bilateral with immedate capillary fill time. Pedal hair growth present. No varicosities and no lower extremity edema present bilateral.   Neruologic: Grossly intact via light touch bilateral. Vibratory intact via tuning fork bilateral. Protective threshold with Semmes Wienstein monofilament intact to all pedal sites bilateral. Patellar and Achilles deep tendon reflexes 2+ bilateral. No Babinski or clonus noted bilateral.   Musculoskeletal: No gross boney pedal deformities bilateral. No pain, crepitus, or limitation noted with foot and ankle range of motion bilateral. Muscular strength 5/5 in all groups tested bilateral.  Mild hammertoe deformities with pain on palpation and range of motion of the second metatarsophalangeal joints bilaterally.  Gait: Unassisted, Nonantalgic.    Radiographs:  Radiographs taken today demonstrate medial deviation of the toes with elongated plantarflexed second metatarsals bilaterally.  Mild hammertoe deformities noted.  Assessment & Plan:   Assessment: Capsulitis of the second metatarsophalangeal joints.  Mild hammertoe deformities.  Plan: Discussed etiology pathology conservative versus surgical therapies at this point I injected 20 mg Kenalog 5 mg Marcaine around the  second metatarsophalangeal joint.  Start her on a Medrol Dosepak and I will follow-up with her in about a month to 6 weeks.     Keymarion Bearman T. Har[place], [place]

## 2019-10-27 ENCOUNTER — Other Ambulatory Visit: Payer: Self-pay

## 2019-10-27 ENCOUNTER — Encounter (INDEPENDENT_AMBULATORY_CARE_PROVIDER_SITE_OTHER): Payer: Self-pay

## 2019-10-27 ENCOUNTER — Ambulatory Visit (INDEPENDENT_AMBULATORY_CARE_PROVIDER_SITE_OTHER): Payer: Medicare Other | Admitting: Podiatry

## 2019-10-27 ENCOUNTER — Encounter: Payer: Self-pay | Admitting: Podiatry

## 2019-10-27 DIAGNOSIS — M778 Other enthesopathies, not elsewhere classified: Secondary | ICD-10-CM

## 2019-10-27 NOTE — Progress Notes (Signed)
She presents today for follow-up of capsulitis bilateral second metatarsophalangeal joints.  She states that she is greater than 50% improved at this point.  She states that she has been trying to make a difference with her shoe gear and not walking barefooted.  She states that everything seems to be working at this point.  Objective: Vital signs are stable alert oriented x3 she has much decrease in erythema and edema to the plantar aspect of the forefoot bilaterally.  She still has medial deviation of the toes and some mild tenderness on palpation of the metatarsal phalangeal joint second bilaterally.  Assessment: Well-healing capsulitis second metatarsophalangeal joints with hammertoe deformities bilateral.  Plan: At this point I encouraged her to continue all conservative therapies as long as she was improving when she hits a plateau or starts to regress that she is to notify us immediately for injections.

## 2021-04-21 DIAGNOSIS — I471 Supraventricular tachycardia, unspecified: Secondary | ICD-10-CM | POA: Insufficient documentation

## 2022-01-04 ENCOUNTER — Ambulatory Visit
Admission: EM | Admit: 2022-01-04 | Discharge: 2022-01-04 | Disposition: A | Discharge status: Home / Self Care | Payer: Medicare PPO | Attending: Physician Assistant | Admitting: Physician Assistant

## 2022-01-04 DIAGNOSIS — J069 Acute upper respiratory infection, unspecified: Secondary | ICD-10-CM | POA: Insufficient documentation

## 2022-01-04 DIAGNOSIS — Z20822 Contact with and (suspected) exposure to covid-19: Secondary | ICD-10-CM | POA: Insufficient documentation

## 2022-01-04 DIAGNOSIS — R051 Acute cough: Secondary | ICD-10-CM | POA: Diagnosis present

## 2022-01-04 LAB — SARS CORONAVIRUS 2 BY RT PCR: SARS Coronavirus 2 by RT PCR: NEGATIVE

## 2022-01-04 MED ORDER — BENZONATATE 200 MG PO CAPS: 200.0000 mg | ORAL_CAPSULE | Freq: Three times a day (TID) | ORAL | 0 refills | Status: DC | PRN

## 2022-01-04 NOTE — ED Triage Notes (Signed)
Patient presents to [facility] for a cough that started last Friday. Patient reports that its pretty consistent.

## 2022-01-04 NOTE — Discharge Instructions (Addendum)
-  We will give you a call if your COVID test is positive.  If positive he will need to wear a mask for Days. - If you do not hear from [place] it is negative and you have another virus and most people get better within 7 to 10 days.  I have sent a cough medication that you may take and you can continue with your throat lozenges. - Return if you develop a fever, worsening cough, breathing difficulty, sinus pain, etc.  URI/COLD SYMPTOMS: Your exam today is consistent with a viral illness. Antibiotics are not indicated at this time. Use medications as directed, including cough syrup, nasal saline, and decongestants. Your symptoms should improve over the next few days and resolve within 7-10 days. Increase rest and fluids. F/u if symptoms worsen or predominate such as sore throat, ear pain, productive cough, shortness of breath, or if you develop high fevers or worsening fatigue over the next several days.

## 2022-01-04 NOTE — ED Provider Notes (Signed)
[facility]    CSN: [id number] Arrival date & time: 01/04/22  4098      History   Chief Complaint Chief Complaint  Patient presents with   Cough    HPI Lauren Roberson is a 76 y.o. female presenting for cough, congestion, postnasal drainage x5 days.  Patient denies fever, fatigue, chest pain or shortness of breath.  She says her chest was a little tight yesterday but her symptoms have all improved from onset.  She denies any sick contacts.  She has been taking over-the-counter menthol cough drops and says that has helped.  Patient has no history of COPD or asthma.  No other complaints.  HPI  Past Medical History:  Diagnosis Date   Hypertension     Patient Active Problem List   Diagnosis Date Noted   Breast pain, left 05/21/2019   Age-related nuclear cataract of both eyes 10/31/2017   OAG (open angle glaucoma) suspect, low risk, bilateral 10/31/2017   HTN (hypertension) 12/25/2012    Past Surgical History:  Procedure Laterality Date   ANKLE SURGERY Right    CESAREAN SECTION     ROTATOR CUFF REPAIR      OB History   No obstetric history on file.      Home Medications    Prior to Admission medications   Medication Sig Start Date End Date Taking? Authorizing Provider  benzonatate (TESSALON) 200 MG capsule Take 1 capsule (200 mg total) by mouth 3 (three) times daily as needed for cough. 01/04/22  Yes Laurene Footman B, PA-C  metoprolol succinate (TOPROL-XL) 25 MG 24 hr tablet Take 25 mg by mouth daily. 06/22/19  Yes [provider]  omeprazole (PRILOSEC) 20 MG capsule Take 20 mg by mouth daily. 08/31/19  Yes [provider]  simvastatin (ZOCOR) 10 MG tablet Take 10 mg by mouth at bedtime. 06/22/19  Yes [provider]  venlafaxine XR (EFFEXOR-XR) 75 MG 24 hr capsule Take 75 mg by mouth daily. 07/17/19  Yes [provider]  benazepril (LOTENSIN) 10 MG tablet Take 10 mg by mouth daily.    [provider]  BIOTIN PO Take  by mouth.    [provider]  Cholecalciferol (D3 PO) Take by mouth.    [provider]  ibuprofen (ADVIL,MOTRIN) 800 MG tablet Take 1 tablet (800 mg total) by mouth 3 (three) times daily. 11/21/14   Betancourt, Aura Fey, NP  meloxicam (MOBIC) 15 MG tablet Take 1 tablet (15 mg total) by mouth daily. 09/15/19   Hyatt, Max T, DPM  methylPREDNISolone (MEDROL DOSEPAK) 4 MG TBPK tablet 6 day dose pack - take as directed 09/15/19   Hyatt, Max T, DPM  Pyridoxine HCl (B-6 PO) Take by mouth.    [provider]  Turmeric 500 MG CAPS Take by mouth.    [provider]    Family History History reviewed. No pertinent family history.  Social History Social History   Tobacco Use   Smoking status: Never   Smokeless tobacco: Never  Vaping Use   Vaping Use: Never used  Substance Use Topics   Alcohol use: Yes    Alcohol/week: 1.0 standard drink of alcohol    Types: 1 Glasses of wine per week   Drug use: No     Allergies   Patient has no known allergies.   Review of Systems Review of Systems  Constitutional:  Negative for chills, diaphoresis, fatigue and fever.  HENT:  Positive for congestion, postnasal drip and rhinorrhea.  Negative for ear pain, sinus pressure, sinus pain and sore throat.   Respiratory:  Positive for cough. Negative for shortness of breath.   Gastrointestinal:  Negative for abdominal pain, nausea and vomiting.  Musculoskeletal:  Negative for arthralgias and myalgias.  Skin:  Negative for rash.  Neurological:  Negative for weakness and headaches.  Hematological:  Negative for adenopathy.     Physical Exam Triage Vital Signs ED Triage Vitals  Enc Vitals Group     BP 01/04/22 0948 (!) 136/96     Pulse Rate 01/04/22 0948 87     Resp --      Temp 01/04/22 0948 98.2 F (36.8 C)     Temp Source 01/04/22 0948 Oral     SpO2 01/04/22 0948 97 %     Weight 01/04/22 0947 182 lb (82.6 kg)     Height 01/04/22 0947 5\' 7"  (1.702 m)     Head  Circumference --      Peak Flow --      Pain Score 01/04/22 0947 0     Pain Loc --      Pain Edu? --      Excl. in GC? --    No data found.  Updated Vital Signs BP (!) 136/96 (BP Location: Left Arm)   Pulse 87   Temp 98.2 F (36.8 C) (Oral)   Ht 5\' 7"  (1.702 m)   Wt 182 lb (82.6 kg)   SpO2 97%   BMI 28.51 kg/m    Physical Exam Vitals and nursing note reviewed.  Constitutional:      General: She is not in acute distress.    Appearance: Normal appearance. She is not ill-appearing or toxic-appearing.  HENT:     Head: Normocephalic and atraumatic.     Nose: Congestion present.     Mouth/Throat:     Mouth: Mucous membranes are moist.     Pharynx: Oropharynx is clear.  Eyes:     General: No scleral icterus.       Right eye: No discharge.        Left eye: No discharge.     Conjunctiva/sclera: Conjunctivae normal.  Cardiovascular:     Rate and Rhythm: Normal rate and regular rhythm.     Heart sounds: Normal heart sounds.  Pulmonary:     Effort: Pulmonary effort is normal. No respiratory distress.     Breath sounds: Normal breath sounds. No wheezing, rhonchi or rales.  Musculoskeletal:     Cervical back: Neck supple.  Skin:    General: Skin is dry.  Neurological:     General: No focal deficit present.     Mental Status: She is alert. Mental status is at baseline.     Motor: No weakness.     Gait: Gait normal.  Psychiatric:        Mood and Affect: Mood normal.        Behavior: Behavior normal.        Thought Content: Thought content normal.      UC Treatments / Results  Labs (all labs ordered are listed, but only abnormal results are displayed) Labs Reviewed  SARS CORONAVIRUS 2 BY RT PCR    EKG   Radiology No results found.  Procedures Procedures (including critical care time)  Medications Ordered in UC Medications - No data to display  Initial Impression / Assessment and Plan / UC Course  I have reviewed the triage vital signs and the nursing  notes.  Pertinent labs &  imaging results that were available during my care of the patient were reviewed by me and considered in my medical decision making (see chart for details).   76 year old female presenting for 5-day history of cough, congestion, postnasal drainage.  Symptoms have improved from onset.  No fever, sore throat or breathing difficulty.  No known COVID exposure.  Vitals are all stable.  She is overall well-appearing.  No distress.  On exam she has mild nasal congestion.  Throat is clear.  Chest clear auscultation heart regular rate and rhythm.   COVID test obtained.  Pending results.  Advised patient we will call her if the results are positive.  Will consider antibiotic therapy if desired.  Reviewed current CDC guidelines, isolation protocol and ED precautions if COVID-positive.  Advised if she does not hear from [place], she likely has another virus.   Advised patient symptoms consistent with viral illness.  Supportive care encouraged.  Sent benzonatate for cough.  Advised her to return if she develops a fever, worsening cough or shortness of breath but advised that she should be feeling better over the next 2 to 5 days.   Final Clinical Impressions(s) / UC Diagnoses   Final diagnoses:  Viral URI with cough  Acute cough     Discharge Instructions      -We will give you a call if your COVID test is positive.  If positive he will need to wear a mask for Days. - If you do not hear from [place] it is negative and you have another virus and most people get better within 7 to 10 days.  I have sent a cough medication that you may take and you can continue with your throat lozenges. - Return if you develop a fever, worsening cough, breathing difficulty, sinus pain, etc.  URI/COLD SYMPTOMS: Your exam today is consistent with a viral illness. Antibiotics are not indicated at this time. Use medications as directed, including cough syrup, nasal saline, and decongestants. Your symptoms  should improve over the next few days and resolve within 7-10 days. Increase rest and fluids. F/u if symptoms worsen or predominate such as sore throat, ear pain, productive cough, shortness of breath, or if you develop high fevers or worsening fatigue over the next several days.       ED Prescriptions     Medication Sig Dispense Auth. Provider   benzonatate (TESSALON) 200 MG capsule Take 1 capsule (200 mg total) by mouth 3 (three) times daily as needed for cough. 20 capsule Shirlee Latch, PA-C      PDMP not reviewed this encounter.   Shirlee Latch, PA-C 01/04/22 1136

## 2022-05-23 ENCOUNTER — Ambulatory Visit: Payer: Medicare PPO | Admitting: Podiatry

## 2022-07-18 ENCOUNTER — Encounter: Payer: Self-pay | Admitting: Podiatry

## 2022-07-18 ENCOUNTER — Ambulatory Visit: Payer: Medicare PPO | Admitting: Podiatry

## 2022-07-18 DIAGNOSIS — M778 Other enthesopathies, not elsewhere classified: Secondary | ICD-10-CM | POA: Diagnosis not present

## 2022-07-18 MED ORDER — TRIAMCINOLONE ACETONIDE 40 MG/ML IJ SUSP
20.0000 mg | Freq: Once | INTRAMUSCULAR | Status: AC
  Administered 2022-07-18: 20 mg

## 2022-07-18 NOTE — Progress Notes (Signed)
Presents today for follow-up of capsulitis states that is really nothing to do disassembled arthritis.  Is mainly in the big toe joint.  Gave me a shot in the right foot before and now is in both.  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  She has some tenderness on palpation of the first metatarsophalangeal joint left.  The most notable deformity here is a pes planovalgus of the right foot that could be resulting in the capsulitis and sesamoiditis this occurring in the to after mentioned joints.  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  Plan: I injected these areas today of the right foot 20 mg Kenalog milligrams Marcaine.  She is scheduled for orthotics to be fabricated.

## 2022-08-01 ENCOUNTER — Ambulatory Visit (INDEPENDENT_AMBULATORY_CARE_PROVIDER_SITE_OTHER): Payer: Medicare PPO

## 2022-08-01 DIAGNOSIS — M7751 Other enthesopathy of right foot: Secondary | ICD-10-CM | POA: Diagnosis not present

## 2022-08-01 DIAGNOSIS — M778 Other enthesopathies, not elsewhere classified: Secondary | ICD-10-CM

## 2022-08-01 DIAGNOSIS — M7752 Other enthesopathy of left foot: Secondary | ICD-10-CM | POA: Diagnosis not present

## 2022-08-01 NOTE — Addendum Note (Signed)
Addended by: Lottie Rater E on: 08/01/2022 04:00 PM   Modules accepted: Orders

## 2022-08-01 NOTE — Progress Notes (Signed)
Patient presents today to be casted for custom molded orthotics. HYATT is the treating physician.  Impression SCAN cast was taken. ABN signed.  Patient info-  Shoe size: 9  Shoe style: ATHLETIC/DRESS  Height: 5FT 7IN  Weight: 183  Insurance: [employer]   Patient will be notified once orthotics arrive in office and reappoint for fitting at that time.

## 2022-10-04 ENCOUNTER — Telehealth: Payer: Self-pay | Admitting: Podiatry

## 2022-10-04 NOTE — Telephone Encounter (Signed)
Left voicemail for patient to call back and schedule an appointment for orthotic pick up 

## 2022-10-13 ENCOUNTER — Ambulatory Visit (INDEPENDENT_AMBULATORY_CARE_PROVIDER_SITE_OTHER): Payer: Medicare PPO | Admitting: Podiatry

## 2022-10-13 DIAGNOSIS — M778 Other enthesopathies, not elsewhere classified: Secondary | ICD-10-CM

## 2022-10-13 NOTE — Progress Notes (Signed)
Patient presents today to pick up orthotics.   Patient was dispense 1 pair of orthotics. Fit was satisfactory. Instructions for break-in and wear was reviewed and a copy was given to the patient.   Re-appointment if they should experience any trouble with insoles.

## 2022-11-15 ENCOUNTER — Ambulatory Visit (INDEPENDENT_AMBULATORY_CARE_PROVIDER_SITE_OTHER): Payer: Medicare PPO

## 2022-11-15 ENCOUNTER — Encounter: Payer: Self-pay | Admitting: Podiatry

## 2022-11-15 ENCOUNTER — Ambulatory Visit: Payer: Medicare PPO | Admitting: Podiatry

## 2022-11-15 DIAGNOSIS — M25871 Other specified joint disorders, right ankle and foot: Secondary | ICD-10-CM | POA: Diagnosis not present

## 2022-11-15 DIAGNOSIS — M778 Other enthesopathies, not elsewhere classified: Secondary | ICD-10-CM | POA: Diagnosis not present

## 2022-11-15 NOTE — Progress Notes (Signed)
Subjective:   Patient ID: Lauren Roberson, female   DOB: 77 y.o.   MRN: [id number]   HPI Patient presents stating she is getting a lot of pain underneath her right foot over her left foot and has had orthotics which have not been helping her at this time.  States that she did have previous bunion surgery years ago   ROS      Objective:  Physical Exam  Neurovascular status intact with keratotic lesion subfirst metatarsal head right thickness when pressed with pressure directly on the sesamoidal complex with mild discomfort left     Assessment:  Worst pain is associated with the sesamoidal complex right     Plan:  H&P x-ray right with metal marker reviewed discussed at great length discussed the consideration for sesamoidectomy met with our ped orthotist who tried to create an accommodation for this area reappoint 1 month and will need to consider sesamoidectomy at the time.  Patient had all questions answered today will be seen back  X-rays indicate that the area of intense discomfort is located around the tibial sesamoid right which is laterally deviated

## 2022-12-13 ENCOUNTER — Ambulatory Visit: Payer: Medicare PPO | Admitting: Podiatry

## 2023-07-05 DIAGNOSIS — E7849 Other hyperlipidemia: Secondary | ICD-10-CM | POA: Insufficient documentation

## 2023-07-05 DIAGNOSIS — K219 Gastro-esophageal reflux disease without esophagitis: Secondary | ICD-10-CM | POA: Insufficient documentation

## 2023-07-05 DIAGNOSIS — K449 Diaphragmatic hernia without obstruction or gangrene: Secondary | ICD-10-CM | POA: Insufficient documentation

## 2023-11-07 ENCOUNTER — Other Ambulatory Visit: Payer: Self-pay | Admitting: Medical Genetics

## 2023-11-09 ENCOUNTER — Other Ambulatory Visit
Admission: RE | Admit: 2023-11-09 | Discharge: 2023-11-09 | Disposition: A | Discharge status: Home / Self Care | Payer: Self-pay | Source: Ambulatory Visit | Attending: Medical Genetics | Admitting: Medical Genetics

## 2023-11-20 LAB — GENECONNECT MOLECULAR SCREEN: Genetic Analysis Overall Interpretation: NEGATIVE

## 2024-03-27 DIAGNOSIS — J329 Chronic sinusitis, unspecified: Secondary | ICD-10-CM | POA: Insufficient documentation

## 2024-04-16 ENCOUNTER — Ambulatory Visit
Admission: EM | Admit: 2024-04-16 | Discharge: 2024-04-16 | Disposition: A | Discharge status: Home / Self Care | Attending: Family Medicine | Admitting: Family Medicine

## 2024-04-16 ENCOUNTER — Ambulatory Visit

## 2024-04-16 DIAGNOSIS — J205 Acute bronchitis due to respiratory syncytial virus: Secondary | ICD-10-CM | POA: Diagnosis not present

## 2024-04-16 DIAGNOSIS — R509 Fever, unspecified: Secondary | ICD-10-CM

## 2024-04-16 DIAGNOSIS — J989 Respiratory disorder, unspecified: Secondary | ICD-10-CM | POA: Diagnosis not present

## 2024-04-16 LAB — POC COVID19/FLU A&B COMBO
Covid Antigen, POC: NEGATIVE
Influenza A Antigen, POC: NEGATIVE
Influenza B Antigen, POC: NEGATIVE

## 2024-04-16 LAB — POCT RESPIRATORY SYNCYTIAL VIRUS: RSV Antigen, POC: POSITIVE — AB

## 2024-04-16 MED ORDER — ACETAMINOPHEN 325 MG PO TABS
975.0000 mg | ORAL_TABLET | Freq: Once | ORAL | Status: AC
  Administered 2024-04-16: 975 mg via ORAL

## 2024-04-16 MED ORDER — PROMETHAZINE-DM 6.25-15 MG/5ML PO SYRP: 5.0000 mL | ORAL_SOLUTION | Freq: Four times a day (QID) | ORAL | 0 refills | Status: AC | PRN

## 2024-04-16 MED ORDER — PREDNISONE 10 MG (21) PO TBPK: ORAL_TABLET | Freq: Every day | ORAL | 0 refills | Status: AC

## 2024-04-16 MED ORDER — AZITHROMYCIN 250 MG PO TABS: ORAL_TABLET | ORAL | 0 refills | Status: AC

## 2024-04-16 MED ORDER — BENZONATATE 200 MG PO CAPS: 200.0000 mg | ORAL_CAPSULE | Freq: Three times a day (TID) | ORAL | 0 refills | Status: AC | PRN

## 2024-04-16 NOTE — ED Triage Notes (Signed)
 Pt c/o cough,chest congestion & bodyaches x6 days. Denies fevers. Has tried coricidin w/o relief.

## 2024-04-16 NOTE — Discharge Instructions (Addendum)
 You have RSV.  I suspect that you also have bronchitis given that you have a fever.  Your COVID and influenza testing was negative.  Your chest x-ray did not show any pneumonia.  Stop at the pharmacy to pick up your cough medications, antibiotics and steroids/prednisone .  If symptoms are not improving, return to the urgent care or see your primary care provider.  If symptoms are worsening, go to the hospital emergency department for further evaluation.

## 2024-04-16 NOTE — ED Provider Notes (Signed)
 " [facility]    CSN: [id number] Arrival date & time: 04/16/24  0854      History   Chief Complaint Chief Complaint  Patient presents with   Cough   Generalized Body Aches    HPI Lauren Roberson is a 79 y.o. female.   HPI  History obtained from the patient. Lauren Roberson presents for 5 days of respiratory symptoms after going to her grandson's New Year's Eve party. Has sore throat, cough, body aches, painful swallowing, chills, nasal congestion and rhinorrhea. Endorses headache. Denies known fever, abdominal pain, vomiting and diarrhea.  Took some Coricidin HBP which didn't help.  Her son got her some Mucinex which helped. Started having productive cough last night.     No history of asthma. Denies vaping and smoking.        Past Medical History:  Diagnosis Date   Hypertension     Patient Active Problem List   Diagnosis Date Noted   Chronic sinusitis 03/27/2024   Gastroesophageal reflux disease without esophagitis 07/05/2023   Hiatal hernia 07/05/2023   Other hyperlipidemia 07/05/2023   SVT (supraventricular tachycardia) 04/21/2021   Breast pain, left 05/21/2019   Age-related nuclear cataract of both eyes 10/31/2017   OAG (open angle glaucoma) suspect, low risk, bilateral 10/31/2017   HTN (hypertension) 12/25/2012    Past Surgical History:  Procedure Laterality Date   ANKLE SURGERY Right    CESAREAN SECTION     ROTATOR CUFF REPAIR      OB History   No obstetric history on file.      Home Medications    Prior to Admission medications  Medication Sig Start Date End Date Taking? Authorizing Provider  azithromycin  (ZITHROMAX  Z-PAK) 250 MG tablet Take 2 tablets on day 1 then 1 tablet daily 04/16/24  Yes Allex Madia, DO  fluticasone (FLONASE) 50 MCG/ACT nasal spray 2 sprays into each nostril two (2) times a day. 03/14/24 03/14/25 Yes [provider]  predniSONE  (STERAPRED UNI-PAK 21 TAB) 10 MG (21) TBPK tablet Take by mouth daily. Take 6 tabs by  mouth daily for 1, then 5 tabs for 1 day, then 4 tabs for 1 day, then 3 tabs for 1 day, then 2 tabs for 1 day, then 1 tab for 1 day. 04/16/24  Yes Dillie Burandt, DO  promethazine -dextromethorphan (PROMETHAZINE -DM) 6.25-15 MG/5ML syrup Take 5 mLs by mouth 4 (four) times daily as needed. 04/16/24  Yes Braxtyn Dorff, DO  benazepril (LOTENSIN) 10 MG tablet Take 10 mg by mouth daily.    [provider]  benzonatate  (TESSALON ) 200 MG capsule Take 1 capsule (200 mg total) by mouth 3 (three) times daily as needed for cough. 04/16/24   Lameka Disla, DO  BIOTIN PO Take by mouth.    [provider]  Cholecalciferol (D3 PO) Take by mouth.    [provider]  diltiazem (CARDIZEM CD) 120 MG 24 hr capsule Take 1 tablet by mouth daily. 03/14/21   [provider]  ibuprofen  (ADVIL ,MOTRIN ) 800 MG tablet Take 1 tablet (800 mg total) by mouth 3 (three) times daily. 11/21/14   Betancourt, Tina A, NP  metoprolol succinate (TOPROL-XL) 25 MG 24 hr tablet Take 25 mg by mouth daily. 06/22/19   [provider]  omeprazole (PRILOSEC) 20 MG capsule Take 20 mg by mouth daily. 08/31/19   [provider]  Pyridoxine HCl (B-6 PO) Take by mouth.    [provider]  simvastatin (ZOCOR) 10 MG tablet Take 10 mg by mouth  at bedtime. 06/22/19   [provider]  Turmeric 500 MG CAPS Take by mouth.    [provider]  venlafaxine XR (EFFEXOR-XR) 75 MG 24 hr capsule Take 75 mg by mouth daily. 07/17/19   [provider]    Family History History reviewed. No pertinent family history.  Social History Social History[1]   Allergies   Patient has no known allergies.   Review of Systems Review of Systems: negative unless otherwise stated in HPI.      Physical Exam Triage Vital Signs ED Triage Vitals  Encounter Vitals Group     BP      Girls Systolic BP Percentile      Girls Diastolic BP Percentile      Boys Systolic BP Percentile      Boys  Diastolic BP Percentile      Pulse      Resp      Temp      Temp src      SpO2      Weight      Height      Head Circumference      Peak Flow      Pain Score      Pain Loc      Pain Education      Exclude from Growth Chart    No data found.  Updated Vital Signs BP (!) 143/67 (BP Location: Right Arm)   Pulse 60   Temp (!) 100.4 F (38 C) (Oral)   Resp 18   Wt 81.7 kg   SpO2 95%   BMI 28.22 kg/m   Visual Acuity Right Eye Distance:   Left Eye Distance:   Bilateral Distance:    Right Eye Near:   Left Eye Near:    Bilateral Near:     Physical Exam GEN:     alert, non-toxic appearing female in no distress    HENT:  mucus membranes moist, oropharyngeal without lesions or erythema, no tonsillar hypertrophy or exudates,  moderate erythematous edematous turbinates, clear nasal discharge EYES:   pupils equal and reactive, no scleral injection or discharge NECK:  no meningismus   RESP:  no increased work of breathing, coarse breath sounds bilaterally bilaterally CVS:   regular rate and rhythm Skin:   warm and dry, no rash on visible skin    UC Treatments / Results  Labs (all labs ordered are listed, but only abnormal results are displayed) Labs Reviewed  POCT RESPIRATORY SYNCYTIAL VIRUS - Abnormal; Notable for the following components:      Result Value   RSV Antigen, POC Positive (*)    All other components within normal limits  POC COVID19/FLU A&B COMBO - Normal    EKG   Radiology DG Chest 2 View Result Date: 04/16/2024 CLINICAL DATA:  Fever and cough. EXAM: CHEST - 2 VIEW COMPARISON:  01/18/2007 FINDINGS: Lungs are adequately inflated and otherwise clear. Cardiomediastinal silhouette and remainder of the exam is unchanged. IMPRESSION: No active cardiopulmonary disease. Electronically Signed   By: Toribio Agreste M.D.   On: 04/16/2024 10:11     Procedures Procedures (including critical care time)  Medications Ordered in UC Medications  acetaminophen   (TYLENOL ) tablet 975 mg (975 mg Oral Given 04/16/24 0947)    Initial Impression / Assessment and Plan / UC Course  I have reviewed the triage vital signs and the nursing notes.  Pertinent labs & imaging results that were available during my care of the patient were reviewed by  me and considered in my medical decision making (see chart for details).       Pt is a 79 y.o. female who presents for 5 days of respiratory symptoms. Elmer is febrile here without recent antipyretics. Satting adequately on room air. Overall pt is non-toxic appearing, well hydrated, without respiratory distress. Pulmonary exam is remarkable for coarse breath sounds bilaterally.  Given that she is febrile recommended chest x-ray and she is agreeable.  POC COVID and influenza panel obtained and was negative. POC RSV is positive.  Chest xray personally reviewed by me without focal pneumonia, pleural effusion, cardiomegaly or pneumothorax.  Radiologist impression reviewed.   Discussed symptomatic treatment.  Tessalon  Perles and Promethazine  DM for cough. Atrovent nasal spray for nasal congestion.  Suspect RSV bronchitis.  Treat with steroids and antibiotics as below.  Typical duration of symptoms discussed.   Return and ED precautions given and voiced understanding. Discussed MDM, treatment plan and plan for follow-up with patient who agrees with plan.     Final Clinical Impressions(s) / UC Diagnoses   Final diagnoses:  Respiratory illness with fever  Acute bronchitis due to respiratory syncytial virus (RSV)     Discharge Instructions      You have RSV.  I suspect that you also have bronchitis given that you have a fever.  Your COVID and influenza testing was negative.  Your chest x-ray did not show any pneumonia.  Stop at the pharmacy to pick up your cough medications, antibiotics and steroids/prednisone .  If symptoms are not improving, return to the urgent care or see your primary care provider.  If symptoms are  worsening, go to the hospital emergency department for further evaluation.     ED Prescriptions     Medication Sig Dispense Auth. Provider   benzonatate  (TESSALON ) 200 MG capsule Take 1 capsule (200 mg total) by mouth 3 (three) times daily as needed for cough. 20 capsule Shatori Bertucci, DO   predniSONE  (STERAPRED UNI-PAK 21 TAB) 10 MG (21) TBPK tablet Take by mouth daily. Take 6 tabs by mouth daily for 1, then 5 tabs for 1 day, then 4 tabs for 1 day, then 3 tabs for 1 day, then 2 tabs for 1 day, then 1 tab for 1 day. 21 tablet Lynea Rollison, DO   promethazine -dextromethorphan (PROMETHAZINE -DM) 6.25-15 MG/5ML syrup Take 5 mLs by mouth 4 (four) times daily as needed. 118 mL Euphemia Lingerfelt, DO   azithromycin  (ZITHROMAX  Z-PAK) 250 MG tablet Take 2 tablets on day 1 then 1 tablet daily 6 tablet Densil Ottey, DO      PDMP not reviewed this encounter.     [1]  Social History Tobacco Use   Smoking status: Never   Smokeless tobacco: Never  Vaping Use   Vaping status: Never Used  Substance Use Topics   Alcohol use: Yes    Alcohol/week: 1.0 standard drink of alcohol    Types: 1 Glasses of wine per week   Drug use: No     Kriste Berth, DO 04/16/24 1049  "
